# Patient Record
Sex: Male | Born: 1980 | Hispanic: No | Marital: Married | Smoking: Former smoker
Health system: Southern US, Community
[De-identification: ages and names within clinical notes are randomized; demographics above are authoritative.]

## PROBLEM LIST (undated history)

## (undated) DIAGNOSIS — W3400XA Accidental discharge from unspecified firearms or gun, initial encounter: Secondary | ICD-10-CM

## (undated) HISTORY — DX: Accidental discharge from unspecified firearms or gun, initial encounter: W34.00XA

## (undated) HISTORY — PX: APPENDECTOMY: SHX54

---

## 2012-02-22 DIAGNOSIS — S0990XA Unspecified injury of head, initial encounter: Secondary | ICD-10-CM

## 2012-02-22 HISTORY — DX: Unspecified injury of head, initial encounter: S09.90XA

## 2020-02-13 ENCOUNTER — Ambulatory Visit: Payer: Self-pay | Attending: Internal Medicine

## 2020-02-13 DIAGNOSIS — Z23 Encounter for immunization: Secondary | ICD-10-CM

## 2020-02-13 NOTE — Progress Notes (Signed)
   Covid-19 Vaccination Clinic  Name:  Ricky Bell    MRN: [id number] DOB: 07/25/1980  02/13/2020  Mr. Ricky Bell was observed post Covid-19 immunization for 15 minutes without incident. He was provided with Vaccine Information Sheet and instruction to access the V-Safe system.   Mr. Ricky Bell was instructed to call 911 with any severe reactions post vaccine: Marland Kitchen Difficulty breathing  . Swelling of face and throat  . A fast heartbeat  . A bad rash all over body  . Dizziness and weakness   Immunizations Administered    Name Date Dose VIS Date Route   Pfizer COVID-19 Vaccine 02/13/2020  3:58 PM 0.3 mL 12/11/2019 Intramuscular   Manufacturer: [employer], [employer]   Lot: [id number]   NDC: [address]-0

## 2020-03-06 ENCOUNTER — Other Ambulatory Visit: Payer: Self-pay

## 2020-03-06 DIAGNOSIS — Z20822 Contact with and (suspected) exposure to covid-19: Secondary | ICD-10-CM

## 2020-03-09 LAB — NOVEL CORONAVIRUS, NAA: SARS-CoV-2, NAA: NOT DETECTED

## 2020-04-21 ENCOUNTER — Ambulatory Visit (HOSPITAL_COMMUNITY)
Admission: EM | Admit: 2020-04-21 | Discharge: 2020-04-21 | Disposition: A | Discharge status: Home / Self Care | Payer: Medicaid Other | Attending: Emergency Medicine | Admitting: Emergency Medicine

## 2020-04-21 ENCOUNTER — Encounter (HOSPITAL_COMMUNITY): Payer: Self-pay

## 2020-04-21 ENCOUNTER — Other Ambulatory Visit: Payer: Self-pay

## 2020-04-21 DIAGNOSIS — L739 Follicular disorder, unspecified: Secondary | ICD-10-CM | POA: Diagnosis not present

## 2020-04-21 MED ORDER — DOXYCYCLINE HYCLATE 100 MG PO CAPS: 100.0000 mg | ORAL_CAPSULE | Freq: Two times a day (BID) | ORAL | 0 refills | Status: DC

## 2020-04-21 MED ORDER — IBUPROFEN 800 MG PO TABS: 800.0000 mg | ORAL_TABLET | Freq: Three times a day (TID) | ORAL | 0 refills | Status: DC | PRN

## 2020-04-21 NOTE — ED Notes (Signed)
Called pt 3 x in lobby, no answer.  

## 2020-04-21 NOTE — Discharge Instructions (Signed)
I have sent in doxycycline for you to take twice a day for 7 days  I have sent in ibuprofen for you to take one tablet every 8 hours as needed for pain and inflammation.

## 2020-04-21 NOTE — ED Triage Notes (Signed)
Pt presents with bumps on right under arm X 3 days. Pt states he started to develop chest pain and right shoulder pain. 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  Pt requesting an excuse note for work.

## 2020-04-21 NOTE — ED Provider Notes (Signed)
[facility]   [id number] 04/21/20 Arrival Time: 1525  CC: RASH  SUBJECTIVE:  Ricky Bell is a 40 y.o. male who presents with a skin complaint that began 2 days ago. Reports that he used an Neurosurgeon to shave his underarm and that he cut it with the razor. Reports that there are now red, painful lumps under the arm. Has had these in the past. Localizes the rash to right axilla. Has not attempted OTC treatment. Pain is worse with clothing and skin rubbing the area. Denies fever, chills, nausea, vomiting, discharge, oral lesions, SOB, chest pain, abdominal pain, changes in bowel or bladder function. Pashto interpreter used this visit.  ROS: As per HPI.  All other pertinent ROS negative.     History reviewed. No pertinent past medical history. History reviewed. No pertinent surgical history. No Known Allergies No current facility-administered medications on file prior to encounter.   No current outpatient medications on file prior to encounter.   Social History   Socioeconomic History  . Marital status: Married    Spouse name: Not on file  . Number of children: Not on file  . Years of education: Not on file  . Highest education level: Not on file  Occupational History  . Not on file  Tobacco Use  . Smoking status: Current Every Day Smoker    Packs/day: 1.00  . Smokeless tobacco: Never Used  Substance and Sexual Activity  . Alcohol use: Never  . Drug use: Never  . Sexual activity: Not on file  Other Topics Concern  . Not on file  Social History Narrative  . Not on file   Social Determinants of Health   Financial Resource Strain: Not on file  Food Insecurity: Not on file  Transportation Needs: Not on file  Physical Activity: Not on file  Stress: Not on file  Social Connections: Not on file  Intimate Partner Violence: Not on file   History reviewed. No pertinent family history.  OBJECTIVE: Vitals:   04/21/20 1634  BP: 114/76  Pulse: 79   Resp: 17  Temp: 97.8 F (36.6 C)  TempSrc: Oral  SpO2: 100%    General appearance: alert; no distress Head: NCAT Lungs: clear to auscultation bilaterally Heart: regular rate and rhythm.  Radial pulse 2+ bilaterally Extremities: no edema Skin: warm and dry; areas of erythema, warmth, tenderness to touch to the right axilla, no drainage noted, no bleeding Psychological: alert and cooperative; normal mood and affect  ASSESSMENT & PLAN:  1. Folliculitis of right axilla     Meds ordered this encounter  Medications  . doxycycline (VIBRAMYCIN) 100 MG capsule    Sig: Take 1 capsule (100 mg total) by mouth 2 (two) times daily.    Dispense:  14 capsule    Refill:  0    Order Specific Question:   Supervising Provider    Answer:   Merrilee Jansky [id number]  . ibuprofen (ADVIL) 800 MG tablet    Sig: Take 1 tablet (800 mg total) by mouth every 8 (eight) hours as needed for moderate pain.    Dispense:  21 tablet    Refill:  0    Order Specific Question:   Supervising Provider    Answer:   Merrilee Jansky [id number]   Prescribed ibuprofen Prescribed doxycycline Take as prescribed and to completion Avoid hot showers/ baths Moisturize skin daily  Follow up with PCP if symptoms persists Return or go to the ER if you have any new  or worsening symptoms such as fever, chills, nausea, vomiting, redness, swelling, discharge, if symptoms do not improve with medications  Reviewed expectations re: course of current medical issues. Questions answered. Outlined signs and symptoms indicating need for more acute intervention. Patient verbalized understanding. After Visit Summary given.   Moshe Cipro, NP 04/21/20 1712

## 2020-06-04 ENCOUNTER — Encounter: Payer: Medicaid Other | Admitting: Internal Medicine

## 2020-06-18 ENCOUNTER — Ambulatory Visit: Payer: Medicaid Other | Admitting: Student

## 2020-06-18 VITALS — BP 128/85 | HR 69 | Wt 182.9 lb

## 2020-06-18 DIAGNOSIS — R519 Headache, unspecified: Secondary | ICD-10-CM

## 2020-06-18 DIAGNOSIS — Z0000 Encounter for general adult medical examination without abnormal findings: Secondary | ICD-10-CM | POA: Diagnosis not present

## 2020-06-18 DIAGNOSIS — Z3009 Encounter for other general counseling and advice on contraception: Secondary | ICD-10-CM | POA: Diagnosis not present

## 2020-06-18 DIAGNOSIS — Z13220 Encounter for screening for lipoid disorders: Secondary | ICD-10-CM

## 2020-06-18 DIAGNOSIS — G8929 Other chronic pain: Secondary | ICD-10-CM

## 2020-06-18 DIAGNOSIS — Z720 Tobacco use: Secondary | ICD-10-CM

## 2020-06-18 DIAGNOSIS — E782 Mixed hyperlipidemia: Secondary | ICD-10-CM

## 2020-06-18 DIAGNOSIS — Z131 Encounter for screening for diabetes mellitus: Secondary | ICD-10-CM

## 2020-06-18 DIAGNOSIS — Z114 Encounter for screening for human immunodeficiency virus [HIV]: Secondary | ICD-10-CM | POA: Diagnosis present

## 2020-06-18 LAB — POCT GLYCOSYLATED HEMOGLOBIN (HGB A1C): Hemoglobin A1C: 5.5 % (ref 4.0–5.6)

## 2020-06-18 LAB — GLUCOSE, CAPILLARY: Glucose-Capillary: 107 mg/dL — ABNORMAL HIGH (ref 70–99)

## 2020-06-18 MED ORDER — BUPROPION HCL ER (SR) 150 MG PO TB12: ORAL_TABLET | ORAL | 0 refills | Status: DC
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
## 2020-06-18 NOTE — Patient Instructions (Signed)
Thank you, Ricky Bell for allowing [place] to provide your care today. Today we discussed your headache, abscess, smoking and vasectomy.    I have ordered the following labs for you:   Lab Orders     Hepatitis C antibody     HIV antibody (with reflex)     Lipid Profile     POC Hbg A1C   I will call if any are abnormal. All of your labs can be accessed through "My Chart".  I have place a referrals to urology for a Vasectomy   I have ordered the following medication/changed the following medications:  1. Start Wellbutrin 150 mg daily for 3 days, then twice daily after that.  2. Take OTC ibuprofen and Tylenol as needed for headache  Please follow-up in 4 weeks  Should you have any questions or concerns please call the internal medicine clinic at [phone].    Sharrell Ku, MD, MPH  Internal Medicine   My Chart Access: [email or web address]   If you have not already done so, please get your COVID 19 vaccine  To schedule an appointment for a COVID vaccine choice any of the following: Go to [email or web address]   Go to [email or web address]                  Call [phone]                                     Call [phone] and select Option 2

## 2020-06-19 LAB — HEPATITIS C ANTIBODY: Hep C Virus Ab: <0.1 s/co ratio (ref 0.0–0.9)

## 2020-06-19 LAB — LIPID PANEL
Chol/HDL Ratio: 6.9 ratio — ABNORMAL HIGH (ref 0.0–5.0)
Cholesterol, Total: 186 mg/dL (ref 100–199)
HDL: 27 mg/dL — ABNORMAL LOW (ref >39)
LDL Chol Calc (NIH): 130 mg/dL — ABNORMAL HIGH (ref 0–99)
Triglycerides: 160 mg/dL — ABNORMAL HIGH (ref 0–149)
VLDL Cholesterol Cal: 29 mg/dL (ref 5–40)

## 2020-06-19 LAB — HIV ANTIBODY (ROUTINE TESTING W REFLEX): HIV Screen 4th Generation wRfx: NONREACTIVE

## 2020-06-20 ENCOUNTER — Encounter: Payer: Self-pay | Admitting: Student

## 2020-06-20 DIAGNOSIS — R519 Headache, unspecified: Secondary | ICD-10-CM | POA: Insufficient documentation

## 2020-06-20 DIAGNOSIS — Z720 Tobacco use: Secondary | ICD-10-CM | POA: Insufficient documentation

## 2020-06-20 DIAGNOSIS — Z3009 Encounter for other general counseling and advice on contraception: Secondary | ICD-10-CM | POA: Insufficient documentation

## 2020-06-20 DIAGNOSIS — E785 Hyperlipidemia, unspecified: Secondary | ICD-10-CM | POA: Insufficient documentation

## 2020-06-20 DIAGNOSIS — Z0000 Encounter for general adult medical examination without abnormal findings: Secondary | ICD-10-CM | POA: Insufficient documentation

## 2020-06-20 NOTE — Assessment & Plan Note (Signed)
HIV and Hepatitis C were both negative today

## 2020-06-20 NOTE — Assessment & Plan Note (Addendum)
He has been smoking about 1 pack of cigarettes a day for 22 years. He tried quitting with the patch but could not tolerate it. He will like to try a medication to help him quit.  Plan: --Start Wellbutrin 150 mg daily, 1 tab for 3 days, then 2 tabs there after. Will continue for 2-3 months if able to quit.  --Follow in 4 weeks to re-evaluate.
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
## 2020-06-20 NOTE — Assessment & Plan Note (Signed)
Patient states he has had 6 children with his wife and would like to stop having children. He is interested in vasectomy and has had this discussion with his wife. He would like to be referred to a surgeon to have it done. Patient was informed that vasectomy is a safe and effective method to help prevent pregnancy and should not affect his sexual performance. Will refer patient to urology for further counseling and evaluation for vasectomy  Plan: --Urology referral for vasectomy --Follow up after procedure
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
## 2020-06-22 ENCOUNTER — Telehealth: Payer: Self-pay

## 2020-06-22 NOTE — Telephone Encounter (Signed)
Return call to [place]pt's sponsor - stated doxycycline is on pt's AVS sheet. Informed her it was discontinued on med list which was probably done after the AVS was printed. Also stated correct pharmacy is [facility] on [address] - changed in pt's chart. Call transferred to front office to schedule pt next appt.

## 2020-06-22 NOTE — Progress Notes (Signed)
Internal Medicine Clinic Attending  Case discussed with Dr. Amponsah  At the time of the visit.  We reviewed the resident's history and exam and pertinent patient test results.  I agree with the assessment, diagnosis, and plan of care documented in the resident's note.  

## 2020-06-22 NOTE — Telephone Encounter (Signed)
Pls contact pt sponsor [phone] regarding medicine

## 2020-06-22 NOTE — Addendum Note (Signed)
Addended by: Erlinda Hong T on: 06/22/2020 08:24 AM   Modules accepted: Level of Service

## 2020-07-15 ENCOUNTER — Ambulatory Visit: Payer: Medicaid Other | Admitting: Student

## 2020-07-15 ENCOUNTER — Encounter: Payer: Self-pay | Admitting: Student

## 2020-07-15 VITALS — BP 108/73 | HR 90 | Ht 70.97 in | Wt 185.8 lb

## 2020-07-15 DIAGNOSIS — Z0000 Encounter for general adult medical examination without abnormal findings: Secondary | ICD-10-CM

## 2020-07-15 DIAGNOSIS — R519 Headache, unspecified: Secondary | ICD-10-CM

## 2020-07-15 DIAGNOSIS — G8929 Other chronic pain: Secondary | ICD-10-CM

## 2020-07-15 DIAGNOSIS — Z720 Tobacco use: Secondary | ICD-10-CM | POA: Diagnosis not present
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
## 2020-07-15 NOTE — Progress Notes (Signed)
   CC: headache  HPI:  [name] is a 40 y.o. male with history listed below presenting to the [facility] for headache. Please see individualized problem based charting for full HPI.  No past medical history on file.  Review of Systems:  Negative aside from that listed in individualized problem based charting.  Physical Exam:  Vitals:   07/15/20 0950  BP: 108/73  Pulse: 90  SpO2: 100%  Weight: 185 lb 12.8 oz (84.3 kg)   Physical Exam Constitutional:      Appearance: Normal appearance.  HENT:     Head: Normocephalic and atraumatic.     Nose: Nose normal. No congestion.     Mouth/Throat:     Mouth: Mucous membranes are moist.     Pharynx: Oropharynx is clear. No oropharyngeal exudate.  Eyes:     Extraocular Movements: Extraocular movements intact.     Conjunctiva/sclera: Conjunctivae normal.     Pupils: Pupils are equal, round, and reactive to light.  Cardiovascular:     Rate and Rhythm: Normal rate and regular rhythm.     Pulses: Normal pulses.     Heart sounds: Normal heart sounds. No murmur heard. No friction rub. No gallop.   Pulmonary:     Effort: Pulmonary effort is normal.     Breath sounds: Normal breath sounds. No wheezing, rhonchi or rales.  Musculoskeletal:        General: No swelling. Normal range of motion.     Cervical back: Normal range of motion.  Skin:    General: Skin is warm and dry.  Neurological:     General: No focal deficit present.     Mental Status: He is alert and oriented to person, place, and time.     Motor: No weakness.     Coordination: Coordination normal.  Psychiatric:        Mood and Affect: Mood normal.        Behavior: Behavior normal.      Assessment & Plan:   See Encounters Tab for problem based charting.  Patient discussed with Dr. Oswaldo Done

## 2020-07-15 NOTE — Assessment & Plan Note (Signed)
Patient has a 22-pack-year history of cigarette smoking.  He emphasizes is urged to monitor quit smoking but is still currently smoking about half a pack a day.  During last visit, he was started on Wellbutrin to aid with smoking cessation.  However he reports that he tried it for 9 to 10 days and did not find it helpful and thus quit.  He has tried nicotine patches in the past and they have not helped.   Commended patient for his continued efforts to stop smoking cigarettes.  Discussed that Wellbutrin therapy may require a longer time to take effect.  Also emphasized that along with Wellbutrin therapy, patient will need to exert his own self-control in terms of reducing smoking.  He confirms understanding.  Discussed other options such as Chantix, but this may not be an option due to cost.  Discussed using Nicorette gum along with Wellbutrin to help with smoking cessation.  Patient notes that he does want to try Nicorette gum as well.  Plan: -Continue Wellbutrin therapy -Start Nicorette gum -Continue encouraging smoking cessation at subsequent visits

## 2020-07-15 NOTE — Patient Instructions (Signed)
Mr Ricky Bell,  It was a pleasure seeing you in the clinic today.   1. Please continue taking Wellbutrin. You can get nicorette gum from grocery store. Please continue trying to stop smoking cigarettes.  2. I have placed a referral to headache clinic for further evaluation of your headache.  Please call our clinic at [phone] if you have any questions or concerns. The best time to call is Monday-Friday from 9am-4pm, but there is someone available 24/7 at the same number. If you need medication refills, please notify your pharmacy one week in advance and they will send [place] a request.   Thank you for letting us take part in your care. We look forward to seeing you next time!

## 2020-07-15 NOTE — Assessment & Plan Note (Signed)
Patient is due for tetanus/Tdap.  However he notes that he has received a lot of vaccinations does not know if he has already obtained his tetanus last Tdap.  States that he will bring his vaccination card at next visit.

## 2020-07-16 NOTE — Progress Notes (Signed)
Internal Medicine Clinic Attending  Case discussed with Dr. Jinwala  At the time of the visit.  We reviewed the resident's history and exam and pertinent patient test results.  I agree with the assessment, diagnosis, and plan of care documented in the resident's note.  

## 2020-07-27 ENCOUNTER — Encounter: Payer: Self-pay | Admitting: Urology

## 2020-07-27 ENCOUNTER — Other Ambulatory Visit: Payer: Self-pay

## 2020-07-27 ENCOUNTER — Ambulatory Visit (INDEPENDENT_AMBULATORY_CARE_PROVIDER_SITE_OTHER): Payer: Medicaid Other | Admitting: Urology

## 2020-07-27 VITALS — BP 100/58 | HR 92 | Ht 70.0 in | Wt 185.0 lb

## 2020-07-27 DIAGNOSIS — Z3009 Encounter for other general counseling and advice on contraception: Secondary | ICD-10-CM | POA: Diagnosis not present

## 2020-07-27 MED ORDER — DIAZEPAM 5 MG PO TABS: 5.0000 mg | ORAL_TABLET | Freq: Once | ORAL | 0 refills | Status: DC | PRN

## 2020-07-27 NOTE — Patient Instructions (Signed)

## 2020-07-27 NOTE — Progress Notes (Signed)
   07/27/20 3:06 PM   Mussa Taiven Greenley 1980/03/21 [id number]  CC: Discuss vasectomy  HPI: Mr. Ricky Bell is a healthy 40 year old male who is a recent immigrant from [place] who is interested in vasectomy for permanent sterilization.  He has 4 children.  He denies any urinary problems.   Social History:  reports that he has been smoking. He has been smoking about 1.00 pack per day. He has never used smokeless tobacco. He reports that he does not drink alcohol and does not use drugs.  Physical Exam: BP (!) 100/58 (BP Location: Left Arm, Patient Position: Sitting, Cuff Size: Normal)   Pulse 92   Ht 5\' 10"  (1.778 m)   Wt 185 lb (83.9 kg)   BMI 26.54 kg/m    Constitutional:  Alert and oriented, No acute distress. Cardiovascular: No clubbing, cyanosis, or edema. Respiratory: Normal respiratory effort, no increased work of breathing. GI: Abdomen is soft, nontender, nondistended, no abdominal masses GU: Circumcised phallus with patent meatus, no lesions, testicles 20 cc and descended bilaterally without masses, vas deferens easily palpable bilaterally   Assessment & Plan:   We discussed the risks and benefits of vasectomy at length.  Vasectomy is intended to be a permanent form of contraception, and does not produce immediate sterility.  Following vasectomy another form of contraception is required until vas occlusion is confirmed by a post-vasectomy semen analysis obtained 2-3 months after the procedure.  Even after vas occlusion is confirmed, vasectomy is not 100% reliable in preventing pregnancy, and the failure rate is approximately 02/1998.  Repeat vasectomy is required in less than 1% of patients.  He should refrain from ejaculation for 1 week after vasectomy.  Options for fertility after vasectomy include vasectomy reversal, and sperm retrieval with in vitro fertilization or ICSI.  These options are not always successful and may be expensive.  Finally, there are other permanent  and non-permanent alternatives to vasectomy available. There is no risk of erectile dysfunction, and the volume of semen will be similar to prior, as the majority of the ejaculate is from the prostate and seminal vesicles.   The procedure takes ~20 minutes.  We recommend patients take 5-10 mg of Valium 30 minutes prior, and he will need a driver post-procedure.  Local anesthetic is injected into the scrotal skin and a small segment of the vas deferens is removed, and the ends occluded. The complication rate is approximately 1-2%, and includes bleeding, infection, and development of chronic scrotal pain.  PLAN: Schedule vasectomy Valium sent to pharmacy   03/1998, MD 07/27/2020  [facility] [address] [place], [place] [phone]

## 2020-08-19 ENCOUNTER — Encounter: Payer: Medicaid Other | Admitting: Urology

## 2020-09-03 ENCOUNTER — Other Ambulatory Visit: Payer: Self-pay

## 2020-09-03 ENCOUNTER — Ambulatory Visit (INDEPENDENT_AMBULATORY_CARE_PROVIDER_SITE_OTHER): Payer: Medicaid Other | Admitting: Urology

## 2020-09-03 ENCOUNTER — Encounter: Payer: Self-pay | Admitting: Urology

## 2020-09-03 VITALS — BP 106/71 | HR 73 | Ht 70.0 in | Wt 185.0 lb

## 2020-09-03 DIAGNOSIS — Z302 Encounter for sterilization: Secondary | ICD-10-CM

## 2020-09-03 NOTE — Progress Notes (Signed)
VASECTOMY PROCEDURE NOTE:  Pashto virtual interpreter used for procedure.  The patient was taken to the minor procedure room and placed in the supine position. His genitals were prepped and draped in the usual sterile fashion. The right vas deferens was brought up to the skin of the right upper scrotum. The skin overlying it was anesthetized with 1% lidocaine without epinephrine, anesthetic was also injected alongside the vas deferens in the direction of the inguinal canal. The no scalpel vasectomy instrument was used to make a small perforation in the scrotal skin. The vasectomy clamp was used to grasp the vas deferens. It was carefully dissected free from surrounding structures. A 1cm segment of the vas was removed, and the cut ends of the mucosa were cauterized.  No significant bleeding was noted. The vas deferens was returned to the scrotum. The skin incision was closed with a simple interrupted stitch of 4-0 chromic.  Attention was then turned to the left side. The left vasectomy was performed in the same exact fashion. Sterile dressings were placed over each incision. The patient tolerated the procedure well.  IMPRESSION/DIAGNOSIS: The patient is a 40 year old gentleman who underwent a vasectomy today. Post-procedure instructions were reviewed. I stressed the importance of continuing to use birth control until he provides a semen specimen more than 2 months from now that demonstrates azoospermia.  We discussed return precautions including fever over 101, significant bleeding or hematoma, or uncontrolled pain. I also stressed the importance of avoiding strenuous activity for one week, no sexual activity or ejaculations for 5 days, intermittent icing over the next 48 hours, and scrotal support.   PLAN: The patient will be advised of his semen analysis results when available.  Legrand Rams, MD 09/03/2020

## 2020-09-03 NOTE — Patient Instructions (Signed)
Vasectomy, Care After This sheet gives you information about how to care for yourself after your procedure. Your health care provider may also give you more specific instructions. If you have problems or questions, contact your health careprovider. What can I expect after the procedure? After the procedure, it is common to have: Mild pain, swelling, or discomfort in your scrotum or redness on your scrotum. Some blood coming from your incisions or puncture sites for 1 or 2 days. Blood in your semen. Follow these instructions at home: Medicines Take over-the-counter and prescription medicines only as told by your health care provider. Avoid taking any medicines that contain aspirin or NSAIDs, such as ibuprofen. These medicines can make bleeding worse. Activity For the first 2 days after surgery, avoid physical activity and exercise that requires a lot of energy. Ask your health care provider what activities are safe for you. Do not take part in sports or perform heavy physical labor until your pain has improved, or until your health care provider says it is okay. You may have limits on the amount of weight you can lift as told by your health care provider. Do not ejaculate for at least 1 week after the procedure, or for as long as you are told. You may resume sexual activity 7-10 days after your procedure, or when your health care provider approves. Use a different method of birth control (contraception) until you have had test results that confirm that there is no sperm in your semen. Scrotal support Use scrotal support, such as a jockstrap or underwear with a supportive pouch, as needed for 1 week after your procedure. If you feel discomfort in your scrotum, you may remove the scrotal support to see if the discomfort is relieved. Sometimes scrotal support can press on the scrotum and cause or worsen discomfort. If your skin gets irritated, you may add some germ-free (sterile), fluffed bandages or  a clean washcloth to the scrotal support. Managing pain and swelling If directed, put ice on the affected area. To do this: Put ice in a plastic bag. Place a towel between your skin and the bag. Leave the ice on for 20 minutes, 2-3 times a day. Remove the ice if your skin turns bright red. This is very important. If you cannot feel pain, heat, or cold, you have a greater risk of damage to the area.  General instructions  Check your incisions or puncture sites every day for signs of infection. Check for: Redness, swelling, or more pain. Fluid or blood. Warmth. Pus or a bad smell. Leave stitches (sutures) in place. The sutures will dissolve on their own and do not need to be removed. Keep all follow-up visits. This is important because you will need a test to confirm that there is no sperm in your semen. Multiple ejaculations are needed to clear out sperm that were beyond the vasectomy site. You will need one test result showing that there is no sperm in your semen before you can resume unprotected sex. This may take 2-4 months after your procedure. If you were given a sedative during the procedure, it can affect you for several hours. Do not drive or operate machinery until your health care provider says that it is safe.  Contact a health care provider if: You have redness, swelling, or more pain around an incision or puncture site, or in your scrotum area. You have bleeding from an incision or puncture site. You have pus or a bad smell coming from an incision   or puncture site. You have a fever. An incision or puncture site opens up. Get help right away if: You develop a rash. You have trouble breathing. Summary After your procedure, it is common to have mild pain, swelling, redness, or discomfort in your scrotum. For the first 2 days after surgery, avoid physical activity and exercise that requires a lot of energy. Put ice on the affected area. Leave the ice on for 20 minutes, 2-3  times a day. If you were given a sedative during the procedure, it can affect you for several hours. Do not drive or operate machinery until your health care provider says that it is safe. This information is not intended to replace advice given to you by your health care provider. Make sure you discuss any questions you have with your healthcare provider. Document Revised: 06/27/2019 Document Reviewed: 06/27/2019 Elsevier Patient Education  2022 [employer]  

## 2020-10-14 ENCOUNTER — Telehealth: Payer: Self-pay | Admitting: Neurology

## 2020-10-14 ENCOUNTER — Encounter: Payer: Self-pay | Admitting: Neurology

## 2020-10-14 ENCOUNTER — Ambulatory Visit: Payer: Medicaid Other | Admitting: Neurology

## 2020-10-14 VITALS — BP 127/72 | HR 79 | Ht 70.87 in | Wt 187.0 lb

## 2020-10-14 DIAGNOSIS — G44321 Chronic post-traumatic headache, intractable: Secondary | ICD-10-CM

## 2020-10-14 DIAGNOSIS — G43709 Chronic migraine without aura, not intractable, without status migrainosus: Secondary | ICD-10-CM | POA: Insufficient documentation

## 2020-10-14 DIAGNOSIS — R519 Headache, unspecified: Secondary | ICD-10-CM

## 2020-10-14 DIAGNOSIS — G08 Intracranial and intraspinal phlebitis and thrombophlebitis: Secondary | ICD-10-CM

## 2020-10-14 DIAGNOSIS — H539 Unspecified visual disturbance: Secondary | ICD-10-CM

## 2020-10-14 DIAGNOSIS — R51 Headache with orthostatic component, not elsewhere classified: Secondary | ICD-10-CM

## 2020-10-14 MED ORDER — NORTRIPTYLINE HCL 25 MG PO CAPS: 25.0000 mg | ORAL_CAPSULE | Freq: Every day | ORAL | 3 refills | Status: AC

## 2020-10-14 MED ORDER — RIZATRIPTAN BENZOATE 10 MG PO TBDP: 10.0000 mg | ORAL_TABLET | ORAL | 11 refills | Status: AC | PRN
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
## 2020-10-14 NOTE — Patient Instructions (Addendum)
Preventative: Start Nortriptyline 30-60 minutes before bedtime (next we would try propranolol or topiramate and then move on to the newer medications such as Emgality and also Botox)  Acute/Emergency: Rizatriptan: Please take one tablet at the onset of your headache. If it does not improve the symptoms please take one additional tablet. Do not take more then 2 tablets in 24hrs. Do not take use more then 2 to 3 times in a week.  MRI of the brainv (we will call you for this appointment) Blood work (today)  IF the medications do not work OR you have side effects, please send me a Clinical cytogeneticist message and we can move on to another medication. Do NOT wait 3 months for appointment, contact me through mychart  Rizatriptan Disintegrating Tablets What is this medication? RIZATRIPTAN (rye za TRIP tan) treats migraines. It works by blocking pain signals and narrowing blood vessels in the brain. It belongs to a group ofmedications called triptans. It is not used to prevent migraines. This medicine may be used for other purposes; ask your health care provider orpharmacist if you have questions. COMMON BRAND NAME(S): Maxalt-MLT What should I tell my care team before I take this medication? They need to know if you have any of these conditions: Cigarette smoker Circulation problems in fingers and toes Diabetes Heart disease High blood pressure High cholesterol History of irregular heartbeat History of stroke Kidney disease Liver disease Stomach or intestine problems An unusual or allergic reaction to rizatriptan, other medications, foods, dyes, or preservatives Pregnant or trying to get pregnant Breast-feeding How should I use this medication? Take this medication by mouth. Follow the directions on the prescription label. Leave the tablet in the sealed blister pack until you are ready to take it. With dry hands, open the blister and gently remove the tablet. If the tablet breaks or crumbles, throw it away  and take a new tablet out of the blister pack. Place the tablet in the mouth and allow it to dissolve, and then swallow. Do not cut, crush, or chew this medication. You do not need water to take thismedication. Do not take it more often than directed. Talk to your care team regarding the use of this medication in children. While this medication may be prescribed for children as young as 6 years for selectedconditions, precautions do apply. Overdosage: If you think you have taken too much of this medicine contact apoison control center or emergency room at once. NOTE: This medicine is only for you. Do not share this medicine with others. What if I miss a dose? This does not apply. This medication is not for regular use. What may interact with this medication? Do not take this medication with any of the following medications: Certain medications for migraine headache like almotriptan, eletriptan, frovatriptan, naratriptan, rizatriptan, sumatriptan, zolmitriptan Ergot alkaloids like dihydroergotamine, ergonovine, ergotamine, methylergonovine MAOIs like Carbex, Eldepryl, Marplan, Nardil, and Parnate This medication may also interact with the following medications: Certain medications for depression, anxiety, or psychotic disorders Propranolol This list may not describe all possible interactions. Give your health care provider a list of all the medicines, herbs, non-prescription drugs, or dietary supplements you use. Also tell them if you smoke, drink alcohol, or use illegaldrugs. Some items may interact with your medicine. What should I watch for while using this medication? Visit your care team for regular checks on your progress. Tell your care teamif your symptoms do not start to get better or if they get worse. You may get drowsy or  dizzy. Do not drive, use machinery, or do anything that needs mental alertness until you know how this medication affects you. Do not stand up or sit up quickly,  especially if you are an older patient. This reduces the risk of dizzy or fainting spells. Alcohol may interfere with theeffect of this medication. Your mouth may get dry. Chewing sugarless gum or sucking hard candy and drinking plenty of water may help. Contact your care team if the problem doesnot go away or is severe. If you take migraine medications for 10 or more days a month, your migraines may get worse. Keep a diary of headache days and medication use. Contact yourcare team if your migraine attacks occur more frequently. What side effects may I notice from receiving this medication? Side effects that you should report to your care team as soon as possible: Allergic reactions-skin rash, itching, hives, swelling of the face, lips, tongue, or throat Burning, pain, tingling, or color changes in the legs or feet Heart attack-pain or tightness in the chest, shoulders, arms, or jaw, nausea, shortness of breath, cold or clammy skin, feeling faint or lightheaded Heart rhythm changes-fast or irregular heartbeat, dizziness, feeling faint or lightheaded, chest pain, trouble breathing Increase in blood pressure Irritability, confusion, fast or irregular heartbeat, muscle stiffness, twitching muscles, sweating, high fever, seizure, chills, vomiting, diarrhea, which may be signs of serotonin syndrome Raynaud's-cool, numb, or painful fingers or toes that may change color from pale, to blue, to red Seizures Stroke-sudden numbness or weakness of the face, arm, or leg, trouble speaking, confusion, trouble walking, loss of balance or coordination, dizziness, severe headache, change in vision Sudden or severe stomach pain, nausea, vomiting, fever, or bloody diarrhea Vision loss Side effects that usually do not require medical attention (report to your careteam if they continue or are bothersome): Dizziness General discomfort or fatigue This list may not describe all possible side effects. Call your doctor for  medical advice about side effects. You may report side effects to FDA at1-800-FDA-1088. Where should I keep my medication? Keep out of the reach of children and pets. Store at room temperature between 15 and 30 degrees C (59 and 86 degrees F). Protect from light and moisture. Throw away any unused medication after theexpiration date. NOTE: This sheet is a summary. It may not cover all possible information. If you have questions about this medicine, talk to your doctor, pharmacist, orhealth care provider.  2022 Elsevier/Gold Standard (2020-03-04 16:19:19)  Nortriptyline capsules What is this medication? NORTRIPTYLINE (nor TRIP ti leen) is used to treat migraines. This medicine may be used for other purposes; ask your health care provider orpharmacist if you have questions. COMMON BRAND NAME(S): Aventyl, Pamelor What should I tell my care team before I take this medication? They need to know if you have any of these conditions: bipolar disorder Brugada syndrome difficulty passing urine glaucoma heart disease if you drink alcohol liver disease schizophrenia seizures suicidal thoughts, plans or attempt; a previous suicide attempt by you or a family member thyroid disease an unusual or allergic reaction to nortriptyline, other tricyclic antidepressants, other medicines, foods, dyes, or preservatives pregnant or trying to get pregnant breast-feeding How should I use this medication? Take this medicine by mouth with a glass of water. Follow the directions on the prescription label. Take your doses at regular intervals. Do not take it more often than directed. Do not stop taking this medicine suddenly except upon the advice of your doctor. Stopping this medicine too quickly may cause  seriousside effects or your condition may worsen. A special MedGuide will be given to you by the pharmacist with eachprescription and refill. Be sure to read this information carefully each time. Talk to your  pediatrician regarding the use of this medicine in children.Special care may be needed. Overdosage: If you think you have taken too much of this medicine contact apoison control center or emergency room at once. NOTE: This medicine is only for you. Do not share this medicine with others. What if I miss a dose? If you miss a dose, take it as soon as you can. If it is almost time for yournext dose, take only that dose. Do not take double or extra doses. What may interact with this medication? Do not take this medicine with any of the following medications: cisapride dronedarone linezolid MAOIs like Carbex, Eldepryl, Marplan, Nardil, and Parnate methylene blue (injected into a vein) pimozide thioridazine This medicine may also interact with the following medications: alcohol antihistamines for allergy, cough, and cold atropine certain medicines for bladder problems like oxybutynin, tolterodine certain medicines for depression like amitriptyline, fluoxetine, sertraline certain medicines for Parkinson's disease like benztropine, trihexyphenidyl certain medicines for stomach problems like dicyclomine, hyoscyamine certain medicines for travel sickness like scopolamine chlorpropamide cimetidine ipratropium other medicines that prolong the QT interval (an abnormal heart rhythm) like dofetilide other medicines that can cause serotonin syndrome like St. John's Wort, fentanyl, lithium, tramadol, tryptophan, buspirone, and some medicines for headaches like sumatriptan or rizatriptan quinidine reserpine thyroid medicine This list may not describe all possible interactions. Give your health care provider a list of all the medicines, herbs, non-prescription drugs, or dietary supplements you use. Also tell them if you smoke, drink alcohol, or use illegaldrugs. Some items may interact with your medicine. What should I watch for while using this medication? Tell your doctor if your symptoms do not get  better or if they get worse. Visit your doctor or health care professional for regular checks on your progress. Because it may take several weeks to see the full effects of this medicine, itis important to continue your treatment as prescribed by your doctor. Patients and their families should watch out for new or worsening thoughts of suicide or depression. Also watch out for sudden changes in feelings such as feeling anxious, agitated, panicky, irritable, hostile, aggressive, impulsive, severely restless, overly excited and hyperactive, or not being able to sleep. If this happens, especially at the beginning of treatment or after a change indose, call your health care professional. Bonita QuinYou may get drowsy or dizzy. Do not drive, use machinery, or do anything that needs mental alertness until you know how this medicine affects you. Do not stand or sit up quickly, especially if you are an older patient. This reduces the risk of dizzy or fainting spells. Alcohol may interfere with the effect ofthis medicine. Avoid alcoholic drinks. Do not treat yourself for coughs, colds, or allergies without asking your doctor or health care professional for advice. Some ingredients can increasepossible side effects. Your mouth may get dry. Chewing sugarless gum or sucking hard candy, and drinking plenty of water may help. Contact your doctor if the problem does notgo away or is severe. This medicine may cause dry eyes and blurred vision. If you wear contact lenses you may feel some discomfort. Lubricating drops may help. See your eye doctorif the problem does not go away or is severe. This medicine can cause constipation. Try to have a bowel movement at least every 2 to 3 days.  If you do not have a bowel movement for 3 days, call yourdoctor or health care professional. This medicine can make you more sensitive to the sun. Keep out of the sun. If you cannot avoid being in the sun, wear protective clothing and use sunscreen.Do not  use sun lamps or tanning beds/booths. What side effects may I notice from receiving this medication? Side effects that you should report to your doctor or health care professionalas soon as possible: allergic reactions like skin rash, itching or hives, swelling of the face, lips, or tongue anxious breathing problems changes in vision confusion elevated mood, decreased need for sleep, racing thoughts, impulsive behavior eye pain fast, irregular heartbeat feeling faint or lightheaded, falls feeling agitated, angry, or irritable fever with increased sweating hallucination, loss of contact with reality seizures stiff muscles suicidal thoughts or other mood changes tingling, pain, or numbness in the feet or hands trouble passing urine or change in the amount of urine trouble sleeping unusually weak or tired vomiting yellowing of the eyes or skin Side effects that usually do not require medical attention (report to yourdoctor or health care professional if they continue or are bothersome): change in sex drive or performance change in appetite or weight constipation dizziness dry mouth nausea tired tremors upset stomach This list may not describe all possible side effects. Call your doctor for medical advice about side effects. You may report side effects to FDA at1-800-FDA-1088. Where should I keep my medication? Keep out of the reach of children. Store at room temperature between 15 and 30 degrees C (59 and 86 degrees F). Keep container tightly closed. Throw away any unused medicine after theexpiration date. NOTE: This sheet is a summary. It may not cover all possible information. If you have questions about this medicine, talk to your doctor, pharmacist, orhealth care provider.  2022 Elsevier/Gold Standard (2019-09-24 15:25:34)

## 2020-10-14 NOTE — Telephone Encounter (Signed)
[employer] Berkley Harvey: [id number] (exp. 10/14/20 to 11/28/20)  MRV pending uploaded notes on the portal.

## 2020-10-15 ENCOUNTER — Telehealth: Payer: Self-pay | Admitting: Neurology

## 2020-10-15 LAB — CBC WITH DIFFERENTIAL/PLATELET
Basophils Absolute: 0.1 10*3/uL (ref 0.0–0.2)
Basos: 1 %
EOS (ABSOLUTE): 0.2 10*3/uL (ref 0.0–0.4)
Eos: 2 %
Hematocrit: 47.3 % (ref 37.5–51.0)
Hemoglobin: 16.1 g/dL (ref 13.0–17.7)
Immature Grans (Abs): 0.1 10*3/uL (ref 0.0–0.1)
Immature Granulocytes: 1 %
Lymphocytes Absolute: 3.1 10*3/uL (ref 0.7–3.1)
Lymphs: 27 %
MCH: 30.9 pg (ref 26.6–33.0)
MCHC: 34 g/dL (ref 31.5–35.7)
MCV: 91 fL (ref 79–97)
Monocytes Absolute: 1.2 10*3/uL — ABNORMAL HIGH (ref 0.1–0.9)
Monocytes: 10 %
Neutrophils Absolute: 7 10*3/uL (ref 1.4–7.0)
Neutrophils: 59 %
Platelets: 256 10*3/uL (ref 150–450)
RBC: 5.21 x10E6/uL (ref 4.14–5.80)
RDW: 12.4 % (ref 11.6–15.4)
WBC: 11.7 10*3/uL — ABNORMAL HIGH (ref 3.4–10.8)

## 2020-10-15 LAB — COMPREHENSIVE METABOLIC PANEL
ALT: 25 IU/L (ref 0–44)
AST: 23 IU/L (ref 0–40)
Albumin/Globulin Ratio: 1.9 (ref 1.2–2.2)
Albumin: 4.9 g/dL (ref 4.0–5.0)
Alkaline Phosphatase: 108 IU/L (ref 44–121)
BUN/Creatinine Ratio: 10 (ref 9–20)
BUN: 10 mg/dL (ref 6–24)
Bilirubin Total: 0.7 mg/dL (ref 0.0–1.2)
CO2: 23 mmol/L (ref 20–29)
Calcium: 9.6 mg/dL (ref 8.7–10.2)
Chloride: 103 mmol/L (ref 96–106)
Creatinine, Ser: 0.97 mg/dL (ref 0.76–1.27)
Globulin, Total: 2.6 g/dL (ref 1.5–4.5)
Glucose: 95 mg/dL (ref 65–99)
Potassium: 4.7 mmol/L (ref 3.5–5.2)
Sodium: 141 mmol/L (ref 134–144)
Total Protein: 7.5 g/dL (ref 6.0–8.5)
eGFR: 100 mL/min/{1.73_m2} (ref >59)

## 2020-10-15 LAB — TSH: TSH: 0.952 u[IU]/mL (ref 0.450–4.500)

## 2020-10-15 NOTE — Telephone Encounter (Signed)
I called spoke to Varnell.  She stated patient had Wellbutrin 150 mg p.o. twice daily.  She was unsure if he was taking this medication.  She will check with him later and then give [place] a call back to let us know as there may be some contraindication in regards to his other medications.

## 2020-10-15 NOTE — Telephone Encounter (Signed)
Pt's sponsor Ricky Bell called needing to discuss the pt's medication with the RN or Provider. Ricky Bell states that the pt is on Wellbutrin and that may interact with the medication that was prescribed to him. Please advise.

## 2020-10-19 ENCOUNTER — Other Ambulatory Visit: Payer: Self-pay | Admitting: Student

## 2020-10-19 NOTE — Telephone Encounter (Signed)
buPROPion (WELLBUTRIN SR) 150 MG 12 hr tablet, REFILL REQUEST @ [facility] - [place] ([place]), [place] - [address]

## 2020-10-19 NOTE — Telephone Encounter (Addendum)
I called Ricky Bell,  pt had reaction to nortriptyline (agitation, paranoia, angry, after first dose , back to baseline approx 3 pm next day).   Not taking now.  He will be taking wellbutrin 150mg  po bid., once he gets prescription from initial provider.  Also the rizatriptan was called in on 24 August at the [facility], she will check on this.  Also MRI MRV ordered or authorized and order sent to [facility] imaging I gave her the number for [phone]  she will call in see where that is in the process. Appreciated call back.

## 2020-10-19 NOTE — Telephone Encounter (Signed)
Pt's sponsor, Inetta Fermo called, following up on   buPROPion (WELLBUTRIN SR) 150 MG 12 hr tablet and nortriptyline (PAMELOR) 25 MG capsule. He has had a reaction to Nortriptyline; cannot sleep, angry, paranoid. Would like a call from the nurse.

## 2020-10-19 NOTE — Telephone Encounter (Signed)
MRV Auth: [id number] (exp. 12/03/20)  Order sent to GI. They will reach out to the patient to schedule.

## 2020-10-20 NOTE — Telephone Encounter (Signed)
I called Ricky Bell back.  I relayed Dr. Trevor Mace message.  She would like to have the patient stay on the Wellbutrin 150mg  twice a day and see how he does for 3 weeks if he has increased headaches then she would let know and then start the topiramate 25 mg daily.  I relayed just to give [place] a call back.  She verbalized understanding.

## 2020-10-22 ENCOUNTER — Telehealth: Payer: Self-pay | Admitting: *Deleted

## 2020-10-22 NOTE — Telephone Encounter (Signed)
-----   Message from Anson Fret, MD sent at 10/15/2020  9:08 AM EDT ----- Labs unremarkable, nothing concerning. Slightly elevated white blood cells, if she is feeling sick with fever she should see her primary care otherwise this is unlikely clinically relevant.  thanks

## 2020-10-22 NOTE — Telephone Encounter (Signed)
Use [employer], agent Rashid (pashto interpreter) to try to reach patient to discuss lab results.  I was told the patient did not have a voicemail set up and he could not reach him.  I called his sponsor Harriett Sine (on [place]) and advised her that overall his labs are unremarkable, nothing concerning.  White blood cells were slightly elevated so if the patient is feeling sick with fever he should see his primary care otherwise this is unlikely clinically relevant.  She verbalized understanding and stated she would get message to the patient.  She verbalized appreciation for the call.

## 2020-11-06 ENCOUNTER — Other Ambulatory Visit: Payer: Medicaid Other

## 2020-11-15 ENCOUNTER — Ambulatory Visit
Admission: RE | Admit: 2020-11-15 | Discharge: 2020-11-15 | Disposition: A | Discharge status: Home / Self Care | Payer: Medicaid Other | Source: Ambulatory Visit | Attending: Neurology | Admitting: Neurology

## 2020-11-15 ENCOUNTER — Other Ambulatory Visit: Payer: Self-pay

## 2020-11-15 DIAGNOSIS — R519 Headache, unspecified: Secondary | ICD-10-CM

## 2020-11-15 DIAGNOSIS — R51 Headache with orthostatic component, not elsewhere classified: Secondary | ICD-10-CM

## 2020-11-15 DIAGNOSIS — G08 Intracranial and intraspinal phlebitis and thrombophlebitis: Secondary | ICD-10-CM | POA: Diagnosis not present

## 2020-11-15 DIAGNOSIS — H539 Unspecified visual disturbance: Secondary | ICD-10-CM

## 2020-11-15 DIAGNOSIS — G44321 Chronic post-traumatic headache, intractable: Secondary | ICD-10-CM

## 2020-11-15 MED ORDER — GADOBENATE DIMEGLUMINE 529 MG/ML IV SOLN
20.0000 mL | Freq: Once | INTRAVENOUS | Status: AC | PRN
  Administered 2020-11-15: 20 mL via INTRAVENOUS

## 2020-11-16 ENCOUNTER — Telehealth: Payer: Self-pay | Admitting: *Deleted

## 2020-11-16 NOTE — Telephone Encounter (Addendum)
Spoke with the patient via [employer] and advised his MRI of the brain is normal and the MRV showed some possible narrowing of the veins.  The patient is aware that Dr. Lucia Gaskins does not think this is anything concerning but she would like to do a follow-up test with a CT scan to look at the venous structures better.  The patient is agreeable to the plan.  He asked for the results to be sent over to his primary care and ok for a sponsor (on [place]) to be notified. He verbalized appreciation for the call.   MRI and MRV results routed to primary care MD. I spoke with Anders Grant (on [place]) and advised her of the results and the plan of care. She verbalized appreciation.   ----- Message from Anson Fret, MD sent at 11/16/2020  9:48 AM EDT ----- MRV showed some possible narrowing of the veins, I would like to do a follow up test with CT scan which is better at looking at the venous structures. I donot think this is anything concerning but would like to double check.   Anson Fret, MD  11/16/2020  9:44 AM EDT Back to Top    MRI of the brainis normal, thanks

## 2020-12-04 ENCOUNTER — Other Ambulatory Visit: Payer: Medicaid Other

## 2020-12-11 ENCOUNTER — Encounter: Payer: Self-pay | Admitting: Urology

## 2020-12-11 ENCOUNTER — Other Ambulatory Visit: Payer: Medicaid Other

## 2020-12-15 ENCOUNTER — Telehealth: Payer: Self-pay | Admitting: Neurology

## 2020-12-15 NOTE — Telephone Encounter (Signed)
Request sent to Dr Lucia Gaskins. This is for CT brain to visual venous structures.

## 2020-12-15 NOTE — Telephone Encounter (Signed)
Ricky Bell is the patient [place] sponsor and she left me a voicemail stating that the patient needs to schedule a CT. I don't see one order. Ricky Bell phone number is [phone].

## 2020-12-16 NOTE — Addendum Note (Signed)
Addended by: Naomie Dean B on: 12/16/2020 10:01 AM   Modules accepted: Orders

## 2020-12-16 NOTE — Telephone Encounter (Signed)
Thank you.  [facility] Berkley Harvey: [id number] (exp. 12/16/20 to 01/30/21 ) order sent to GI, they will reach out to the patient to schedule.

## 2020-12-18 ENCOUNTER — Other Ambulatory Visit: Payer: Self-pay

## 2020-12-18 ENCOUNTER — Other Ambulatory Visit: Payer: Medicaid Other

## 2020-12-18 DIAGNOSIS — Z302 Encounter for sterilization: Secondary | ICD-10-CM

## 2020-12-19 LAB — POST-VAS SPERM EVALUATION,QUAL: Volume: 1.4 mL

## 2020-12-25 ENCOUNTER — Telehealth: Payer: Self-pay

## 2020-12-25 NOTE — Telephone Encounter (Signed)
-----   Message from Lissa Hoard, CMA sent at 12/23/2020  1:19 PM EDT -----  ----- Message ----- From: Sondra Come, MD Sent: 12/22/2020   8:13 AM EDT To: Lissa Hoard, CMA  Sperm still present, needs to clear additional sperm, and repeat more sensitive analysis in 6 weeks, thanks  Legrand Rams, MD 12/22/2020

## 2020-12-25 NOTE — Telephone Encounter (Signed)
Called pt using interpreter services (interpreter ID [id number]) advised pt of information below. Pt voiced understanding. Per pt request called pt's sponsor and informed her of the information as well. She requests that the requisition be mailed with instructions as they live 40 minutes away. Instructions and lab req mailed.

## 2021-01-18 ENCOUNTER — Telehealth: Payer: Self-pay | Admitting: Neurology

## 2021-01-18 NOTE — Telephone Encounter (Signed)
Never received the appt for ct follow up and is wondering if needing to r/s this appt.

## 2021-01-18 NOTE — Telephone Encounter (Addendum)
Spoke with patient's sponsor Anders Grant (on [place]). GI tried to reach pt on 10/26 and 11/2 but were unable to LVM. Inetta Fermo will call GI to schedule. She also mentioned pt is off on Fridays. She would also like to know if tomorrow's appt is necessary given pt still has a pending test. Per Dr Lucia Gaskins, ok to cancel appt and can reschedule with MM NP. I canceled the appt for 11/29 and LVM for Inetta Fermo advising her of this and that we would call back to r/s the appt.

## 2021-01-19 ENCOUNTER — Ambulatory Visit: Payer: Medicaid Other | Admitting: Neurology

## 2021-02-19 ENCOUNTER — Ambulatory Visit
Admission: RE | Admit: 2021-02-19 | Discharge: 2021-02-19 | Disposition: A | Discharge status: Home / Self Care | Payer: Medicaid Other | Source: Ambulatory Visit | Attending: Neurology | Admitting: Neurology

## 2021-02-19 DIAGNOSIS — H539 Unspecified visual disturbance: Secondary | ICD-10-CM

## 2021-02-19 DIAGNOSIS — R519 Headache, unspecified: Secondary | ICD-10-CM

## 2021-02-19 DIAGNOSIS — G08 Intracranial and intraspinal phlebitis and thrombophlebitis: Secondary | ICD-10-CM

## 2021-02-19 DIAGNOSIS — R51 Headache with orthostatic component, not elsewhere classified: Secondary | ICD-10-CM

## 2021-02-19 IMAGING — CT CT VENOGRAM HEAD
3 of 5 series · 9 of 37 positions shown · IV contrast (iopamidol)
Comparison: Head MRI and MRV 11/15/2020

CLINICAL DATA: Worsening chronic headaches. History of head trauma.
Reported history of venous thrombosis.

EXAM:
CT VENOGRAM HEAD
TECHNIQUE: Venographic phase images of the brain were obtained following the
administration of intravenous contrast. Multiplanar reformats and
maximum intensity projections were generated.
CONTRAST:  75mL L3CMH9-GRV IOPAMIDOL (L3CMH9-GRV) INJECTION 76%

[Series 3: head w/(date) · axial · 0.43mm/px · z∈[+1081,+1141]mm · 2 of 36 slices shown]
[im 12/36  brain]
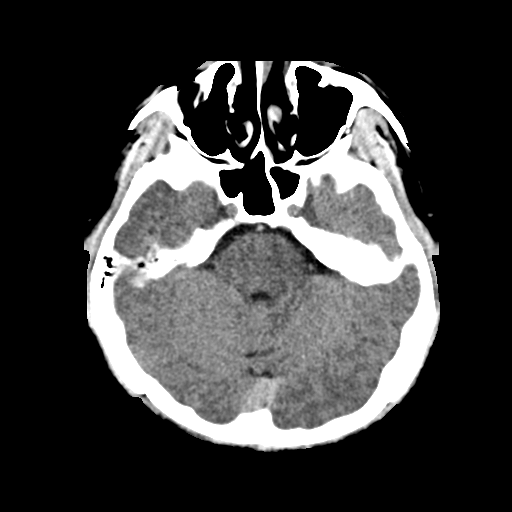
[im 24/36  brain]
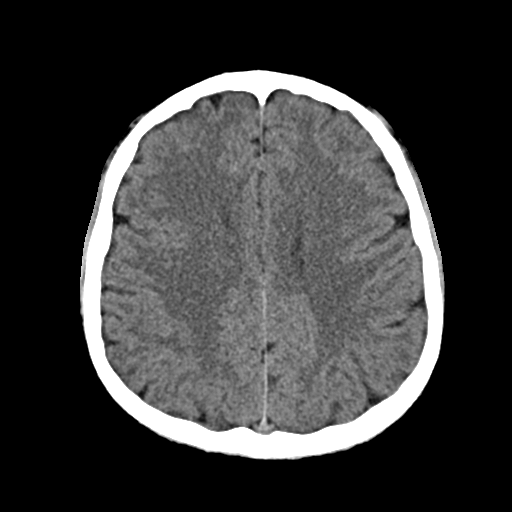

[Series 4: head angio · axial · 0.43mm/px · z∈[+1056,+1164]mm · 4 of 60 slices shown]
[im 12/60  brain]
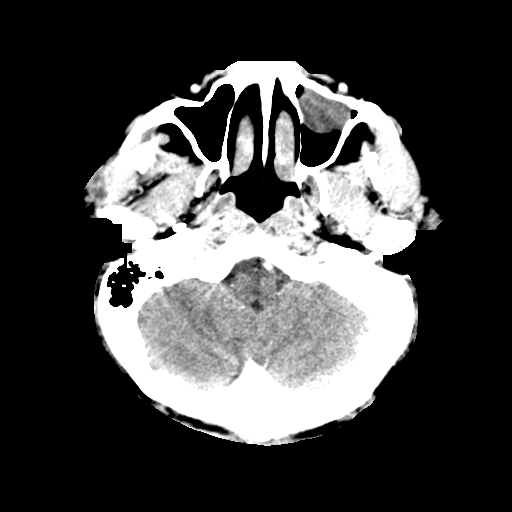
[im 24/60  bone]
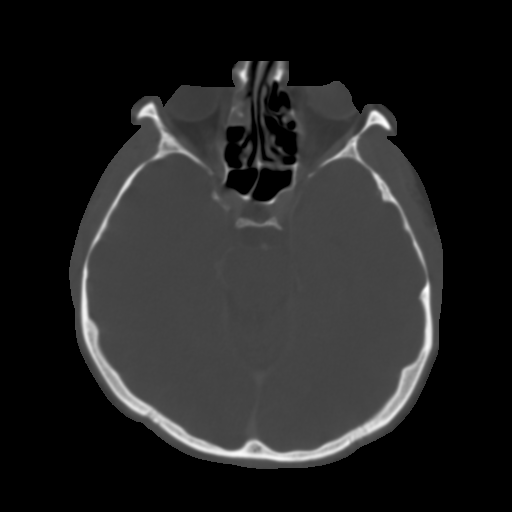
[im 36/60  brain]
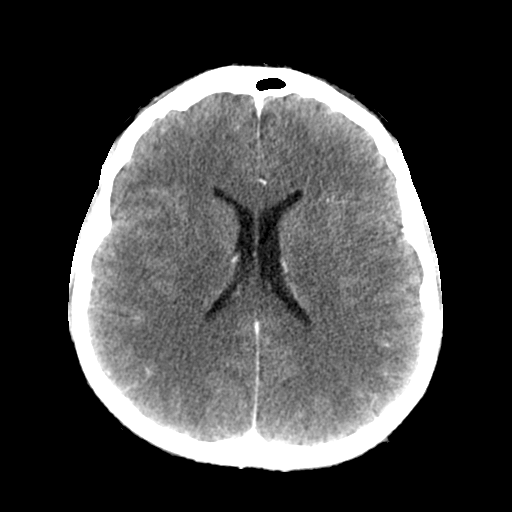
[im 48/60  bone]
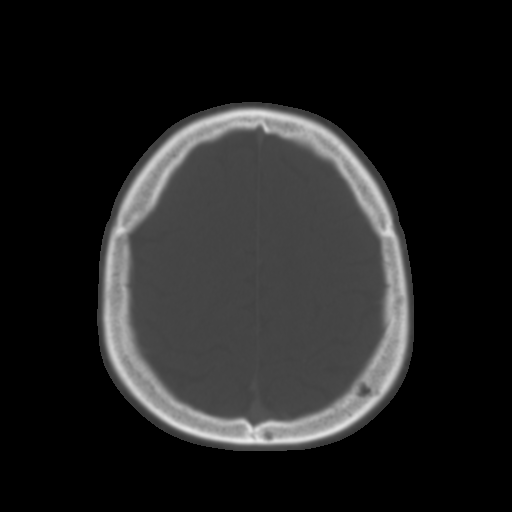

[Series 6: sag w/(date) · sagittal · 0.36mm/px · 3 of 66 slices shown]
[im 36/66  brain]
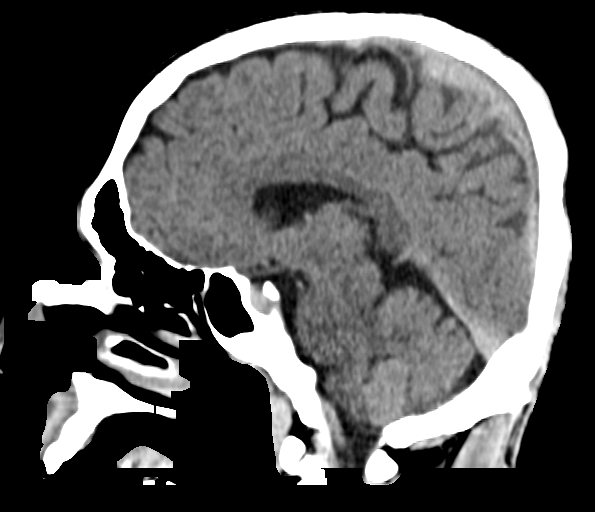
[im 43/66  brain]
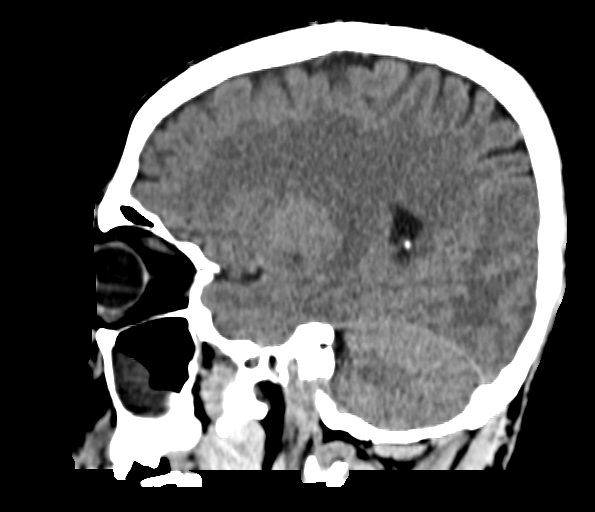
[im 50/66  brain]
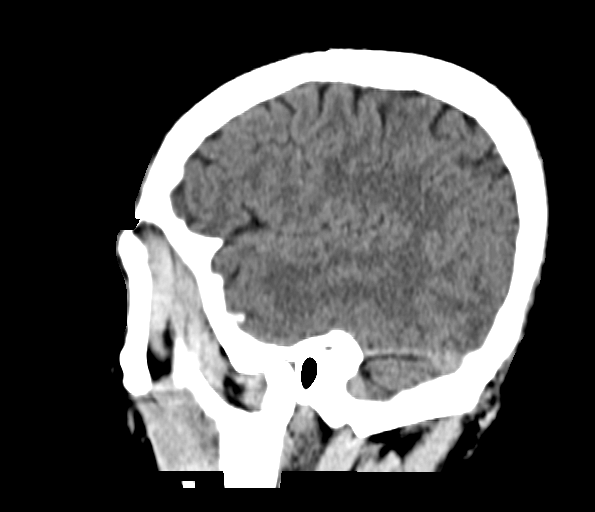

[9 of 37 positions shown; findings below may reference images not displayed]

FINDINGS: Noncontrast head CT demonstrates no evidence of an acute infarct,
intracranial hemorrhage, mass, midline shift, or extra-axial fluid
collection. The ventricles and sulci are normal.

No skull fracture or suspicious osseous lesion is identified. There
is new mucosal thickening and fluid in the right frontal and
anterior right ethmoid sinuses. A mucous retention cyst is again
noted in the left maxillary sinus. The left mastoid air cells are
underdeveloped. The orbits are unremarkable.

The superior sagittal sinus, internal cerebral veins, vein of Jaylon,
straight sinus, transverse sinuses, sigmoid sinuses, and jugular
bulbs are patent. There is multifocal irregularity of the superior
sagittal sinus including a severe focal stenosis in its midportion
(series 9, image 143), similar to the prior MRI.
IMPRESSION: 1. Patent dural venous sinuses.
2. Severe focal stenosis of the superior sagittal sinus, similar to
the prior MRI and potentially secondary to a remote sinus thrombosis
given the history.
3. Unremarkable noncontrast CT appearance of the brain.

## 2021-02-19 MED ORDER — IOPAMIDOL (ISOVUE-370) INJECTION 76%
75.0000 mL | Freq: Once | INTRAVENOUS | Status: AC | PRN
  Administered 2021-02-19: 75 mL via INTRAVENOUS

## 2021-02-23 ENCOUNTER — Telehealth: Payer: Self-pay | Admitting: Neurology

## 2021-02-23 DIAGNOSIS — G932 Benign intracranial hypertension: Secondary | ICD-10-CM

## 2021-02-23 DIAGNOSIS — G441 Vascular headache, not elsewhere classified: Secondary | ICD-10-CM

## 2021-02-23 NOTE — Telephone Encounter (Signed)
The imaging of the veins in patient's head show that at some point he may have had a clot. This can increase pressure in the brain and cause headaches(secondary intracranial hypertension). I would like to check a lumbar puncture for opening pressure to see if the stenosis is causing this. If it is, we may be able to consider other treatment like stenting. Discuss with patient and see if he is willing and I can place order. Unless the medication we treated him with is making him feel better then we can hold off. Thanks (cc pcp)   CTV: IMPRESSION: 1. Patent dural venous sinuses. 2. 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 3. Unremarkable noncontrast CT appearance of the brain.

## 2021-02-24 NOTE — Telephone Encounter (Signed)
I called pt, spoke thru interpreter <ABASIN REF # [id number] (language services).  Relayed the results of testing as per listed below. He has not had any improvement with the medication that he was given.  Is willing to proceed with the LP at [facility].  I relayed once insurance authorizes then they will call to schedule.  Dr. Lucia Gaskins will place order. (Make note that needs PASHTO interpreter).  He did not have any questions.  He verbalized understanding of results and POC.

## 2021-02-25 NOTE — Telephone Encounter (Signed)
Thank you for the update Ricky Bell. I am hoping we can finally find a way to relieve his headaches.

## 2021-02-25 NOTE — Addendum Note (Signed)
Addended by: Guy Begin on: 02/25/2021 04:04 PM   Modules accepted: Orders

## 2021-03-01 NOTE — Addendum Note (Signed)
Addended by: Sarina Ill B on: 03/01/2021 05:14 PM   Modules accepted: Orders

## 2021-07-05 ENCOUNTER — Telehealth: Payer: Self-pay | Admitting: Urology

## 2021-07-05 NOTE — Telephone Encounter (Signed)
Adela Glimpse called for the patient asking if we could send the order  form for the patient to have his sperm checked again. He lost the other form that you sent out. She said she would make sure that he had a sterile cup. ?Please send to the patient's home address listed in the chart. ?Her contact number is [phone] ? ? ?Thanks, ?Sharyn Lull ?

## 2021-07-13 NOTE — Telephone Encounter (Signed)
Lab req and order sheet mailed to patient.

## 2021-07-27 ENCOUNTER — Other Ambulatory Visit: Payer: Self-pay | Admitting: Urology

## 2021-08-02 LAB — POST VAS SEMEN ANALYSIS, AUA
Total Immotile Sperm: 24.6 x10E6/mL — ABNORMAL HIGH (ref <0.10)
Total Motile Sperm: 57.4 x10E6/mL — ABNORMAL HIGH
Volume: 2.3 mL

## 2021-08-04 NOTE — Telephone Encounter (Signed)
Pt would like another req to be mailed to their house.  I read message that [name] put in to [place].

## 2021-08-06 NOTE — Telephone Encounter (Signed)
All necessary documents mailed.

## 2022-10-29 ENCOUNTER — Encounter (HOSPITAL_COMMUNITY): Payer: Self-pay

## 2022-10-29 ENCOUNTER — Other Ambulatory Visit: Payer: Self-pay

## 2022-10-29 ENCOUNTER — Emergency Department (HOSPITAL_COMMUNITY)
Admission: EM | Admit: 2022-10-29 | Discharge: 2022-10-29 | Disposition: A | Discharge status: Home / Self Care | Payer: Medicaid Other | Attending: Emergency Medicine | Admitting: Emergency Medicine

## 2022-10-29 DIAGNOSIS — S0500XA Injury of conjunctiva and corneal abrasion without foreign body, unspecified eye, initial encounter: Secondary | ICD-10-CM | POA: Diagnosis not present

## 2022-10-29 DIAGNOSIS — H5713 Ocular pain, bilateral: Secondary | ICD-10-CM | POA: Diagnosis present

## 2022-10-29 DIAGNOSIS — X58XXXA Exposure to other specified factors, initial encounter: Secondary | ICD-10-CM | POA: Diagnosis not present

## 2022-10-29 MED ORDER — OFLOXACIN 0.3 % OP SOLN
1.0000 [drp] | Freq: Four times a day (QID) | OPHTHALMIC | Status: DC
  Administered 2022-10-29: 1 [drp] via OPHTHALMIC
  Filled 2022-10-29: qty 5

## 2022-10-29 MED ORDER — TETRACAINE HCL 0.5 % OP SOLN
2.0000 [drp] | Freq: Once | OPHTHALMIC | Status: AC
  Administered 2022-10-29: 2 [drp] via OPHTHALMIC
  Filled 2022-10-29: qty 4

## 2022-10-29 MED ORDER — FLUORESCEIN SODIUM 1 MG OP STRP
1.0000 | ORAL_STRIP | Freq: Once | OPHTHALMIC | Status: AC
  Administered 2022-10-29: 1 via OPHTHALMIC
  Filled 2022-10-29: qty 1

## 2022-10-29 NOTE — ED Provider Notes (Cosign Needed Addendum)
[place] EMERGENCY DE[facility]MENT AT [facility] Provider Note   CSN: [id number] Arrival date & time: 10/29/22  1940     History  Chief Complaint  Patient presents with   Eye Pain    Ricky Bell is a 42 y.o. male.   Eye Pain  Patient presents to ED with son and sponsor at bedside. 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 He was not wearing any face shield or glasses. He went grocery shopping and took a nap this afternoon, he woke up with severe bilateral eye pain, roughly around 5 PM. He reports feeling like there is something in his eye. The pain is 10/10. He denies wearing any contacts. No other acute symptoms or concerns.     Home Medications Prior to Admission medications   Medication Sig Start Date End Date Taking? Authorizing Provider  buPROPion (WELLBUTRIN SR) 150 MG 12 hr tablet TAKE 1 TABLET BY MOUTH ONCE DAILY FOR 3 DAYS, THEN 1 TABLET TWICE DAILY FOR 27 DAYS 10/20/20   Steffanie Rainwater, MD  IBUPROFEN PO Take 800 mg by mouth as needed.    [provider]  nortriptyline (PAMELOR) 25 MG capsule Take 1 capsule (25 mg total) by mouth at bedtime. Patient not taking: Reported on 10/19/2020 10/14/20   Anson Fret, MD  rizatriptan (MAXALT-MLT) 10 MG disintegrating tablet Take 1 tablet (10 mg total) by mouth as needed for migraine. May repeat in 2 hours if needed 10/14/20   Anson Fret, MD      Allergies    Nortriptyline    Review of Systems   Review of Systems  Eyes:  Positive for pain.    Physical Exam Updated Vital Signs BP 128/78   Pulse 71   Temp 97.7 F (36.5 C) (Oral)   Resp 18   SpO2 100%  Physical Exam Constitutional:      Appearance: Normal appearance.  HENT:     Head: Normocephalic and atraumatic.  Eyes:     General:        Right eye: Discharge present.        Left eye: Discharge present.    Conjunctiva/sclera:     Right eye: Right conjunctiva is injected.     Left eye: Left conjunctiva is  injected.  Cardiovascular:     Rate and Rhythm: Normal rate and regular rhythm.  Pulmonary:     Effort: Pulmonary effort is normal.     Breath sounds: Normal breath sounds.  Abdominal:     General: Bowel sounds are normal.     Palpations: Abdomen is soft.  Musculoskeletal:        General: Normal range of motion.     Cervical back: Normal range of motion and neck supple.  Skin:    General: Skin is warm and dry.  Neurological:     Mental Status: He is alert.     ED Results / Procedures / Treatments   Labs (all labs ordered are listed, but only abnormal results are displayed) Labs Reviewed - No data to display  EKG None  Radiology No results found.  Procedures Procedures    Medications Ordered in ED Medications  ofloxacin (OCUFLOX) 0.3 % ophthalmic solution 1 drop (has no administration in time range)  tetracaine (PONTOCAINE) 0.5 % ophthalmic solution 2 drop (2 drops Both Eyes Given 10/29/22 2030)  fluorescein ophthalmic strip 1 strip (1 strip Both Eyes Given 10/29/22 2031)    ED Course/ Medical Decision Making/  A&P   {                                Medical Decision Making Risk Prescription drug management.   This patient presents to the ED for concern of eye pain, this involves an extensive number of treatment options, and is a complaint that carries with it a high risk of complications and morbidity.  The differential diagnosis includes contact Vitas, corneal abrasion, scleritis, uveitis, orbital cellulitis.   Co morbidities that complicate the patient evaluation  None   Additional history obtained:  Additional history obtained from none External records from outside source obtained and reviewed including none   Lab Tests:  None   Imaging Studies ordered:  No imaging ordered   Cardiac Monitoring: / EKG:  None   Consultations Obtained:  No consultation needed   Problem List / ED Course / Critical interventions / Medication  management  Corneal abrasion/ Bilateral Eye Pain  I ordered medication including tetracaine ophthalmic solution and ofloxacin ophthalmic solution  Reevaluation of the patient after these medicines showed that the patient improved I have reviewed the patients home medicines and have made adjustments as needed   Social Determinants of Health:  Patient is from [place] who recently moved to [place], speaks Ecologist, has sponsor.    Test / Admission - Considered:  This is a 42 year old male who presents to ED for bilateral eye pain.  He reports he was welding at his house this afternoon without wearing any facial protection.  He reported he woke up this afternoon with severe eye pain and felt like there was something in his eye.  On slit exam showed no evidence of foreign bodies to his cornea. Seidel sign negative. Patient reported that tetracaine ophthalmic solution improved his eye pain. Patient is advised to put one drop in both eyes every 2 hours for the next 24 hours and then discard the vial. Patient is also given ofloxacin eye drops in both eyes, and given to take home with him. Patient and sponsor is advised to follow up with ophthalmology.   Final Clinical Impression(s) / ED Diagnoses Final diagnoses:  None    Rx / DC Orders ED Discharge Orders     None         Jeral Pinch, DO 10/29/22 2109    Jeral Pinch, DO 10/29/22 2111    Lorre Nick, MD 10/30/22 [phone]

## 2022-10-29 NOTE — ED Triage Notes (Signed)
Sponsor and family used for triage. Pashto preferred.   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

## 2022-10-29 NOTE — ED Provider Notes (Signed)
I saw and evaluated the patient, reviewed the resident's note and I agree with the findings and plan.      Patient presents with eye pain after welding but not using a mask.  On slit-lamp exam, patient had no evidence of foreign bodies to his cornea.  Fluorescein staining showed no evidence of dye uptake.  Seidel sign negative.  Patient will be given dose of antibiotics here ocular as well as follow-up to go home with an ophthalmology referral   Lorre Nick, MD 10/29/22 2051

## 2022-10-29 NOTE — Discharge Instructions (Addendum)
Use 1 drop to each eye every 4 hours for the next 5 days.  Call the ophthalmologist to schedule a follow-up visit  Use 1 drop of the tetracaine every 1-2 hours for the next 24 hours and then stop

## 2023-03-06 ENCOUNTER — Emergency Department (HOSPITAL_COMMUNITY)
Admission: EM | Admit: 2023-03-06 | Discharge: 2023-03-06 | Disposition: A | Discharge status: Home / Self Care | Payer: Medicaid Other

## 2023-03-06 ENCOUNTER — Encounter (HOSPITAL_COMMUNITY): Payer: Self-pay

## 2023-03-06 ENCOUNTER — Other Ambulatory Visit: Payer: Self-pay

## 2023-03-06 DIAGNOSIS — Z20822 Contact with and (suspected) exposure to covid-19: Secondary | ICD-10-CM | POA: Insufficient documentation

## 2023-03-06 DIAGNOSIS — R509 Fever, unspecified: Secondary | ICD-10-CM | POA: Diagnosis present

## 2023-03-06 DIAGNOSIS — J09X2 Influenza due to identified novel influenza A virus with other respiratory manifestations: Secondary | ICD-10-CM | POA: Insufficient documentation

## 2023-03-06 DIAGNOSIS — J101 Influenza due to other identified influenza virus with other respiratory manifestations: Secondary | ICD-10-CM

## 2023-03-06 LAB — RESP PANEL BY RT-PCR (RSV, FLU A&B, COVID)  RVPGX2
Influenza A by PCR: POSITIVE — AB
Influenza B by PCR: NEGATIVE
Resp Syncytial Virus by PCR: NEGATIVE
SARS Coronavirus 2 by RT PCR: NEGATIVE

## 2023-03-06 MED ORDER — ONDANSETRON HCL 4 MG PO TABS: 4.0000 mg | ORAL_TABLET | Freq: Four times a day (QID) | ORAL | 0 refills | Status: AC | PRN

## 2023-03-06 MED ORDER — LIDOCAINE VISCOUS HCL 2 % MT SOLN
15.0000 mL | Freq: Once | OROMUCOSAL | Status: AC
  Administered 2023-03-06: 15 mL via OROMUCOSAL
  Filled 2023-03-06: qty 15

## 2023-03-06 MED ORDER — ACETAMINOPHEN 325 MG PO TABS
650.0000 mg | ORAL_TABLET | Freq: Once | ORAL | Status: AC | PRN
  Administered 2023-03-06: 650 mg via ORAL
  Filled 2023-03-06: qty 2

## 2023-03-06 NOTE — ED Provider Notes (Signed)
 [place] EMERGENCY DEPARTMENT AT [facility] Provider Note   CSN: [id number] Arrival date & time: 03/06/23  1311     History  Chief Complaint  Patient presents with   Fever    Ricky Bell is a 43 y.o. male who presents to the ED for evaluation of URI symptoms.  Reports that yesterday developed sore throat, body aches and chills, headache, nausea.  Denies any shortness of breath, chest pain, vomiting, diarrhea.  Reports that both of his daughters at home are sick with unknown illness.  Reports he is been trying to take Tylenol  at home for symptoms with little relief. Denies abdominal pain. Denies trouble swallowing.   Fever Associated symptoms: headaches, nausea and sore throat   Associated symptoms: no chest pain, no chills, no diarrhea, no myalgias and no vomiting        Home Medications Prior to Admission medications   Medication Sig Start Date End Date Taking? Authorizing Provider  ondansetron  (ZOFRAN ) 4 MG tablet Take 1 tablet (4 mg total) by mouth every 6 (six) hours as needed for nausea or vomiting. 03/06/23  Yes Ruthell Lonni FALCON, PA-C  buPROPion  (WELLBUTRIN  SR) 150 MG 12 hr tablet TAKE 1 TABLET BY MOUTH ONCE DAILY FOR 3 DAYS, THEN 1 TABLET TWICE DAILY FOR 27 DAYS 10/20/20   Lou Claretta HERO, MD  IBUPROFEN  PO Take 800 mg by mouth as needed.    [provider]  nortriptyline  (PAMELOR ) 25 MG capsule Take 1 capsule (25 mg total) by mouth at bedtime. Patient not taking: Reported on 10/19/2020 10/14/20   Ines Onetha NOVAK, MD  rizatriptan  (MAXALT -MLT) 10 MG disintegrating tablet Take 1 tablet (10 mg total) by mouth as needed for migraine. May repeat in 2 hours if needed 10/14/20   Ines Onetha NOVAK, MD      Allergies    Nortriptyline     Review of Systems   Review of Systems  Constitutional:  Positive for fever. Negative for chills.  HENT:  Positive for sore throat. Negative for trouble swallowing.   Respiratory:  Negative for shortness of  breath.   Cardiovascular:  Negative for chest pain.  Gastrointestinal:  Positive for nausea. Negative for abdominal pain, diarrhea and vomiting.  Musculoskeletal:  Negative for myalgias.  Neurological:  Positive for headaches.  All other systems reviewed and are negative.   Physical Exam Updated Vital Signs BP 113/74   Pulse (!) 130   Temp (!) 102.9 F (39.4 C) (Oral)   Resp 18   Ht 5' 10 (1.778 m)   Wt 84 kg   SpO2 97%   BMI 26.57 kg/m  Physical Exam Vitals and nursing note reviewed.  Constitutional:      General: He is not in acute distress.    Appearance: Normal appearance. He is not ill-appearing, toxic-appearing or diaphoretic.  HENT:     Head: Normocephalic and atraumatic.     Nose: Nose normal.     Mouth/Throat:     Mouth: Mucous membranes are moist.     Pharynx: Oropharynx is clear. Posterior oropharyngeal erythema present. No oropharyngeal exudate.     Comments: Uvula midline, handling secretions appropriately, no drooling, no change in phonation Eyes:     Extraocular Movements: Extraocular movements intact.     Conjunctiva/sclera: Conjunctivae normal.     Pupils: Pupils are equal, round, and reactive to light.  Neck:     Comments: No meningismus Cardiovascular:     Rate and Rhythm: Regular rhythm. Tachycardia present.  Pulmonary:  Effort: Pulmonary effort is normal.     Breath sounds: Normal breath sounds. No wheezing.  Abdominal:     General: Abdomen is flat. Bowel sounds are normal.     Palpations: Abdomen is soft.     Tenderness: There is no abdominal tenderness.  Musculoskeletal:     Cervical back: Normal range of motion and neck supple. No tenderness.  Skin:    General: Skin is warm and dry.     Capillary Refill: Capillary refill takes less than 2 seconds.  Neurological:     Mental Status: He is alert and oriented to person, place, and time.     ED Results / Procedures / Treatments   Labs (all labs ordered are listed, but only abnormal  results are displayed) Labs Reviewed  RESP PANEL BY RT-PCR (RSV, FLU A&B, COVID)  RVPGX2 - Abnormal; Notable for the following components:      Result Value   Influenza A by PCR POSITIVE (*)    All other components within normal limits    EKG None  Radiology No results found.  Procedures Procedures   Medications Ordered in ED Medications  lidocaine  (XYLOCAINE ) 2 % viscous mouth solution 15 mL (has no administration in time range)  acetaminophen  (TYLENOL ) tablet 650 mg (650 mg Oral Given 03/06/23 1321)    ED Course/ Medical Decision Making/ A&P  Medical Decision Making Risk OTC drugs. Prescription drug management.   43 year old male presents for evaluation.  Please see HPI for further details.  On examination patient is febrile to 102.9, tachycardic.  Lung sounds clear bilaterally, he is not hypoxic.  His abdomen is soft and compressible throughout with no tenderness noted.  His uvula is midline with erythema.  No exudate.  He is handling secretions appropriately, there is no drooling, there is no change in phonation.  Overall nontoxic appearance.  Suspect patient tachycardia secondary to viral illness.  Patient was given tylenol  for his fever.  Will send home with Zofran  for nausea.  Will write him a work note.  Patient is influenza A positive here which I suspect is the cause of his symptoms.  Have advised him to trial conservative measures at home.  Have given him return precautions and he voiced understanding.  He is stable to discharge home at this time.   Final Clinical Impression(s) / ED Diagnoses Final diagnoses:  Influenza A    Rx / DC Orders ED Discharge Orders          Ordered    ondansetron  (ZOFRAN ) 4 MG tablet  Every 6 hours PRN        03/06/23 1535              Ruthell Lonni FALCON, PA-C 03/06/23 1537    Neysa Caron PARAS, DO 03/06/23 2211

## 2023-03-06 NOTE — Discharge Instructions (Addendum)
 It was a pleasure taking part in your care.  As we discussed, you have the flu.  Please take Tylenol  at home for headaches and fevers every 6 hours as needed.  You may also take ibuprofen  every 6 hours as needed.  Please purchase Zicam which is over-the-counter.  You may also purchase Chloraseptic sore throat spray as well as utilizing warm salt water gargles for sore throat.  Please take Zofran  every 6 hours as needed for nausea.  Please follow-up with your primary care doctor.  Please see the attached work note.  Please continue to remain hydrated by pushing fluid such as Gatorade, water.  ????? ?? ??????? ?? ???? ?????? ? ???? ???? ??.  ??? ???? ?? ??? ??? ???? ???? ??? ???.  ??????? ???? ? ????? ??? ?? ?? ?? 6 ??????? ?? ? ?? ??? ?? ??? ????? ?? ??? ?? ??????? ?????.  ???? ???? ?? ? ????? ??? ?? ?? ?? 6 ??????? ?? ibuprofen  ?????.  ??????? ???? ????? ????? ??? ?? ? ?????? ??? ??? ??.  ???? ???? ?? ? ????? ??? ? ?????????? ???? ?? ????? ?? ???????? ? ????? ??? ????? ? ???? ?????? ???? ???????? ??????.  ??????? ???? ?? ?? 6 ??????? ?? Zofran  ????? ??? ???? ?? ? ??? ?????? ????? ????? ??.  ??????? ???? ? ??? ?????? ??????? ????? ??? ????? ???.  ??????? ???? ????? ???? ????? ?????.  ??????? ???? ? ??????? ??? ???????? ? ???? ?? ??????? ??? ??????? ???? ???? ?? ???? ?????. staso paa pamlrni ke barkha akistal da saks incorporated.  laka sanga chi mog bahs wakrh, taso flo larai.  meherbani wakri da artya sara sam paa valar 6 saatuno ke da sar dard ao tabi lapara paa kor ke telinol wakhle.  taso koli shai da artya sara sam paa valar 6 saatuno ke ibuprofen  wakhuri.  meherbani wakri zikam wakhle kom chi da kawonter sakha der di.  taso koli shai da stuni dard da kaloraspatik spari valm wakhle ao chehmdaranga da stuni dard lapara da garmo malgio obo enzo cox.  meherbani wakri paa valar 6 saatuno ke Zofran  wakhuri laka sanga chi da zra badwali lapara artya wee.  meherbani wakri da khpal lumrani pamlrni docter  sara taqib kri.  meherbani wakri zamima kari yadaxt ogorai.  meherbani wakri da colgate laka geetorid , obo paa fasharolo sara valydrat paty kedo taa dawam warkri.

## 2023-03-06 NOTE — ED Triage Notes (Signed)
 C/o headache, runny nose, cough, and body aches x1 day.
